# Patient Record
Sex: Female | Born: 1990 | Hispanic: No | Marital: Single | Smoking: Never smoker
Health system: Southern US, Community
[De-identification: ages and names within clinical notes are randomized; demographics above are authoritative.]

## PROBLEM LIST (undated history)

## (undated) ENCOUNTER — Inpatient Hospital Stay: Payer: Self-pay

---
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
## 2008-01-22 ENCOUNTER — Observation Stay: Payer: Self-pay | Admitting: Obstetrics and Gynecology

## 2008-03-10 ENCOUNTER — Observation Stay: Payer: Self-pay

## 2008-03-14 ENCOUNTER — Inpatient Hospital Stay: Payer: Self-pay | Admitting: Obstetrics and Gynecology

## 2009-05-12 IMAGING — US US OB US >=[ID] SNGL FETUS
1 series · 17 of 28 positions shown · non-contrast
Comparison: none

REASON FOR EXAM: 19 WEEKS PREGNANT, VAGINAL BLEEDING AND RIGHT PELVIC
PAIN STATUS POST LIFTING, ALSO EVALUATE APPENDIX
COMMENTS:

[Series 1: us ob us >=(id) sngl fetus · 17 of 72 slices shown]
[im 1/72]
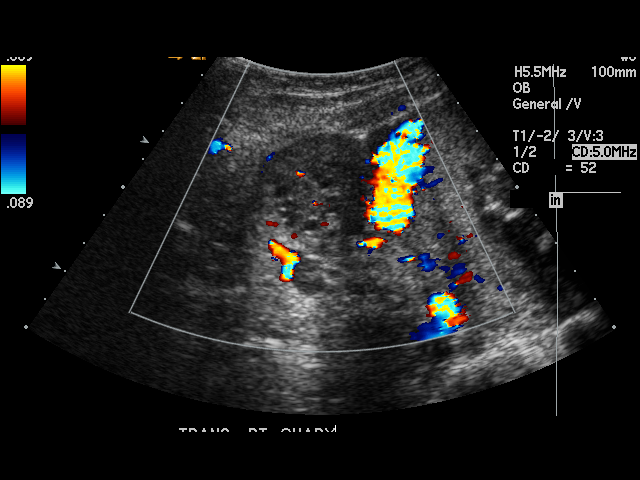
[im 6/72]
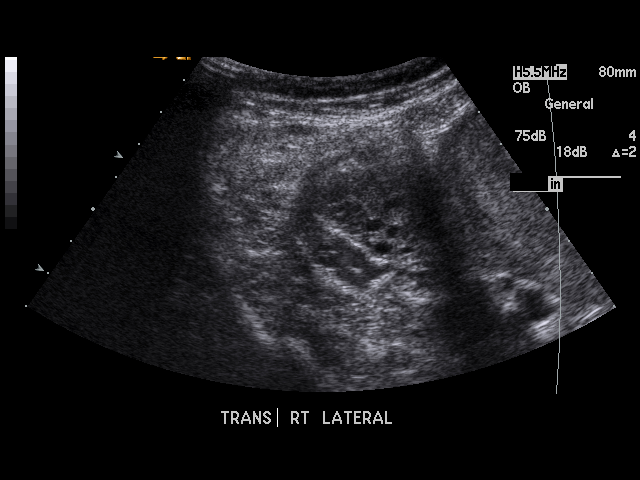
[im 11/72]
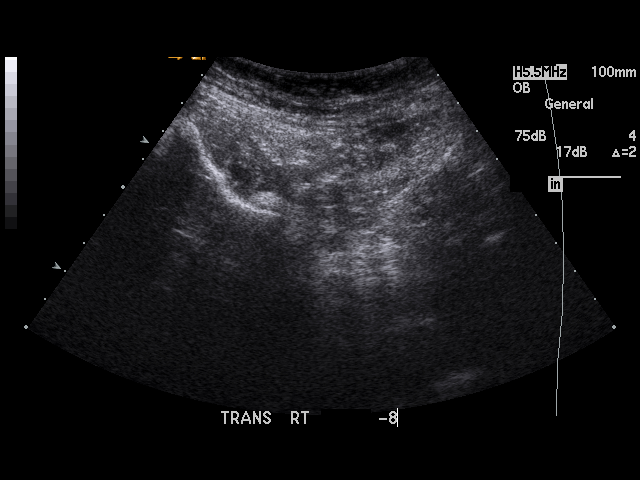
[im 14/72]
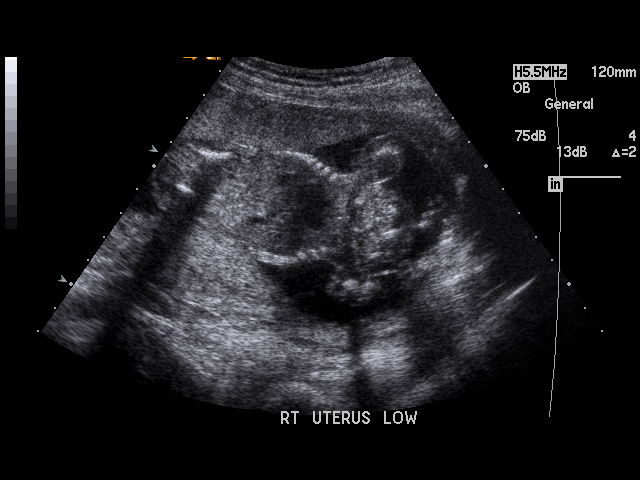
[im 19/72]
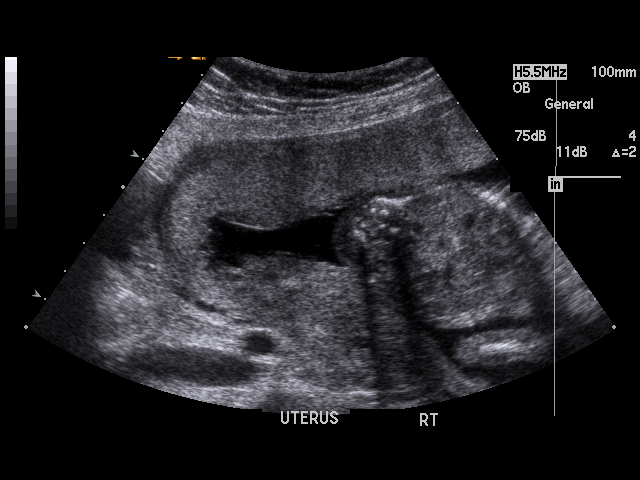
[im 24/72]
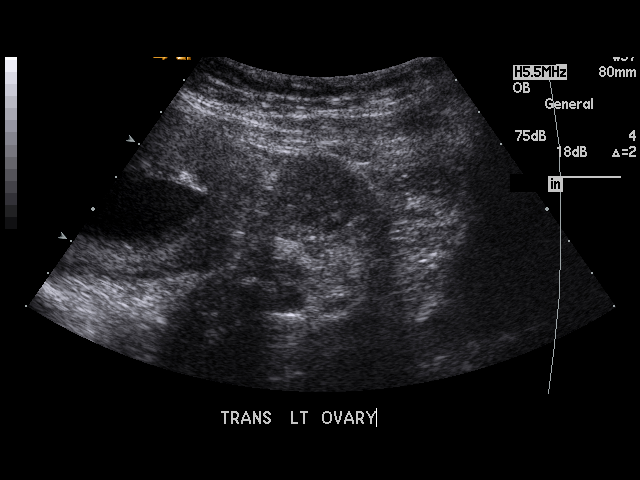
[im 27/72]
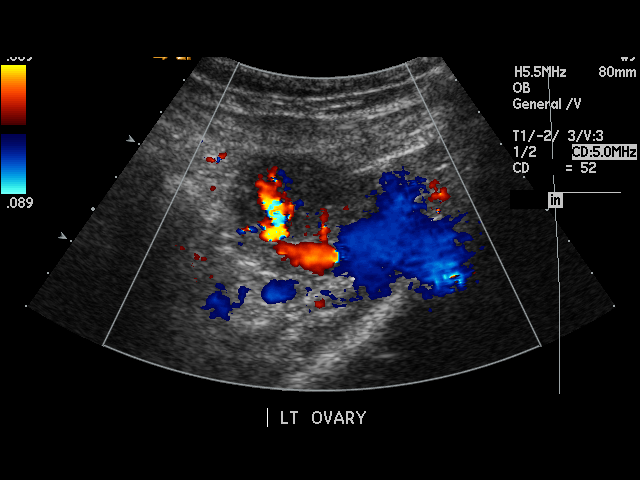
[im 32/72]
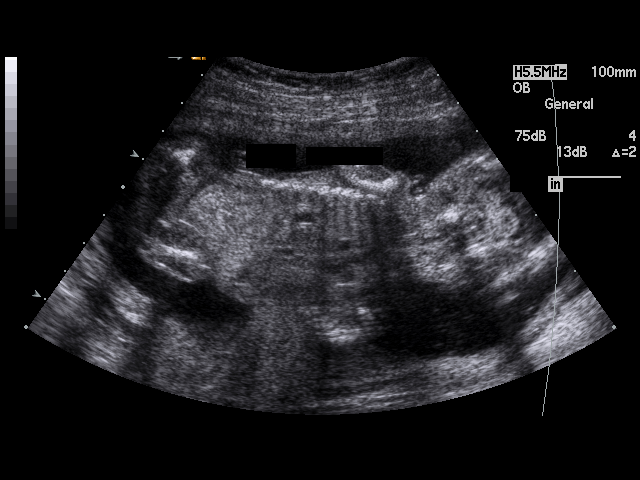
[im 37/72]
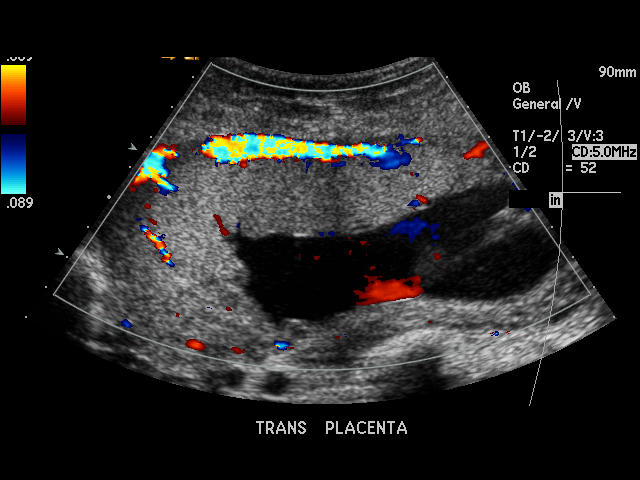
[im 40/72]
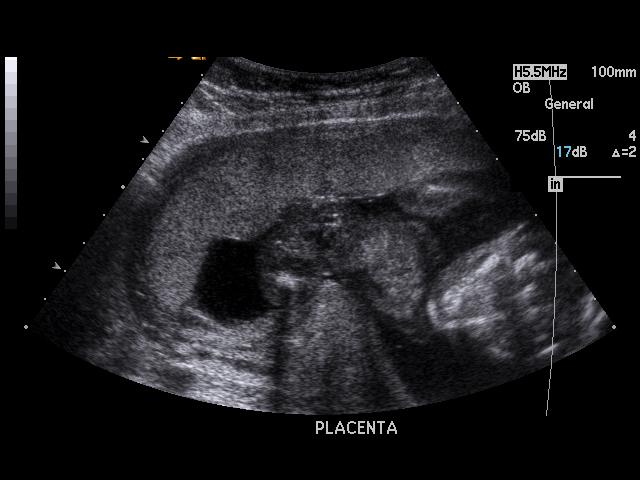
[im 45/72]
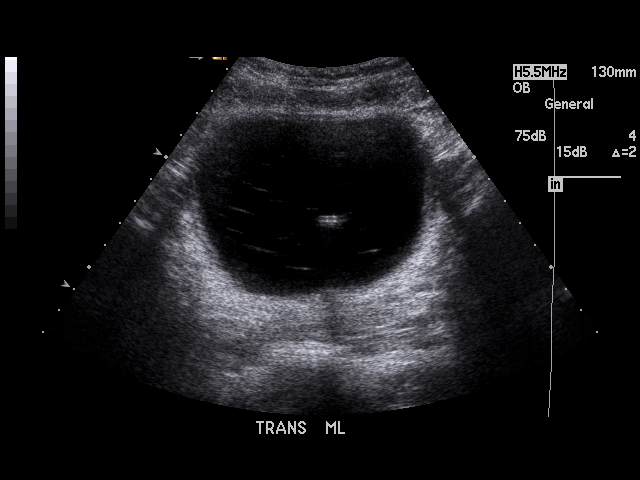
[im 48/72]
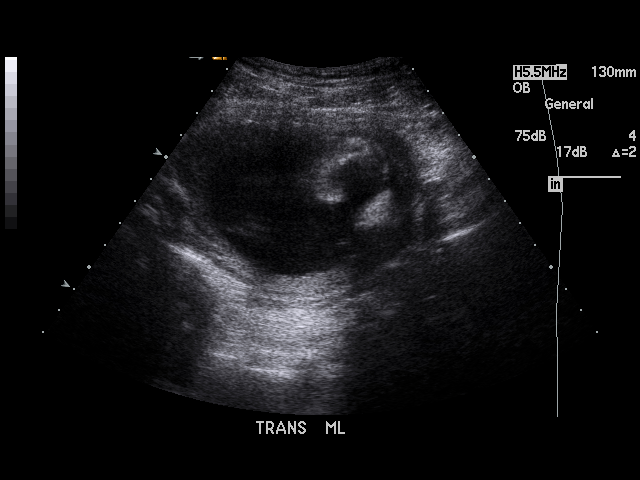
[im 53/72]
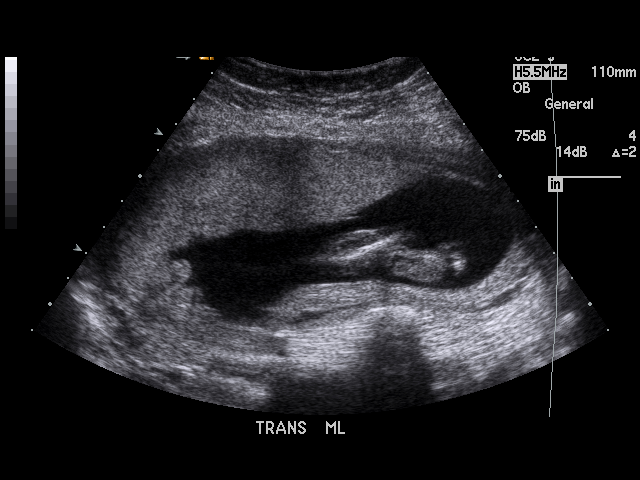
[im 58/72]
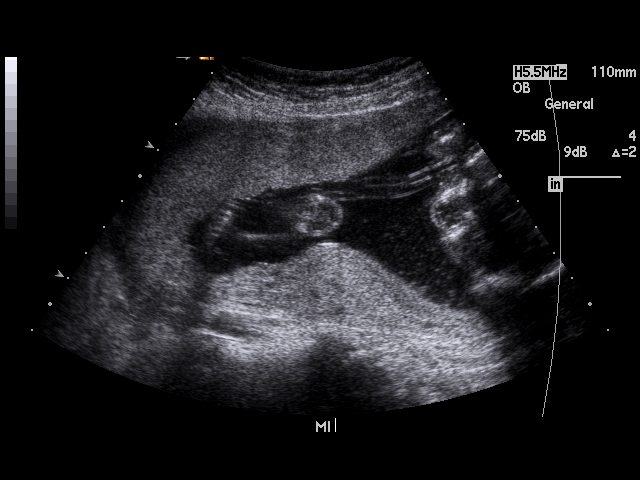
[im 61/72]
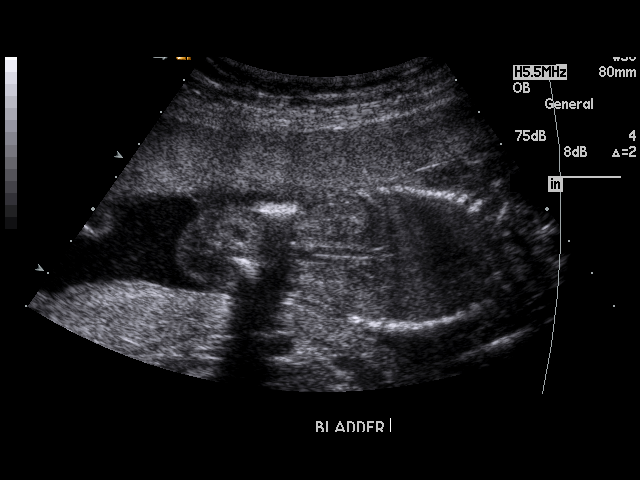
[im 66/72]
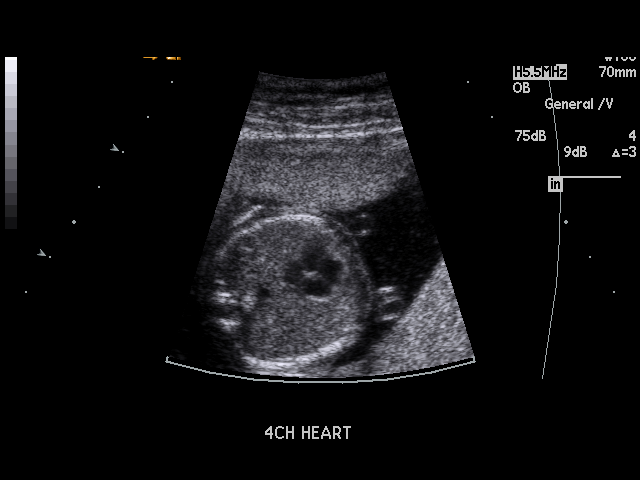
[im 72/72]
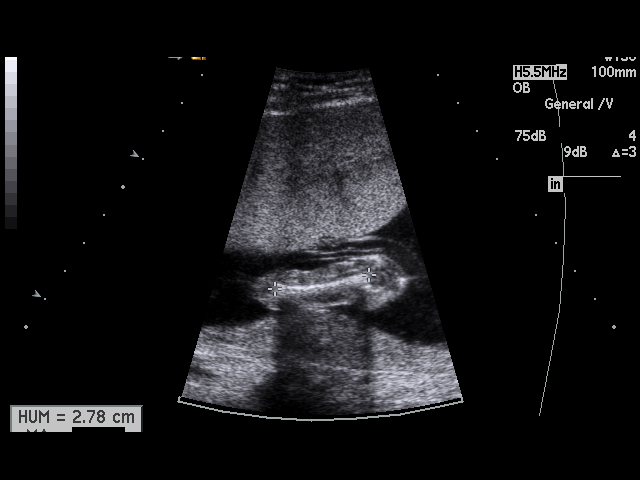

[17 of 28 positions shown; findings below may reference images not displayed]

PROCEDURE:     US  - US OB GREATER/OR EQUAL TO N1LLC  - October 19, 2007 [DATE]

RESULT:     Emergent sonographic evaluation of the pelvis demonstrates a
single intrauterine fetus with normal cardiac, trunk and extremity motion.
The fetal heart rate measures 147 beats per minute. The placenta is Grade 0
and anterior in position. There is no evidence of placenta previa or
marginal placenta. The amniotic fluid volume appears to be normal. The
gestational age is 18 weeks, 6 days sonographically with an estimated
delivery date of 03/15/2008. There is no gross fetal abnormality evident.
IMPRESSION: Intrauterine gestation of 18 weeks, 6 days sonographically
with an estimated delivery date of 03/15/2008 and a fetal heart rate of 147
beats per minute.

## 2009-07-19 IMAGING — US ULTRAOUND OB LIMITED - NRPT [facility]
1 series · 14 of 28 positions shown · non-contrast
Comparison: none

[Series 1: ultraound ob limited - nrpt [facility] · 0.27mm/px · 14 of 34 slices shown]
[im 2/34]
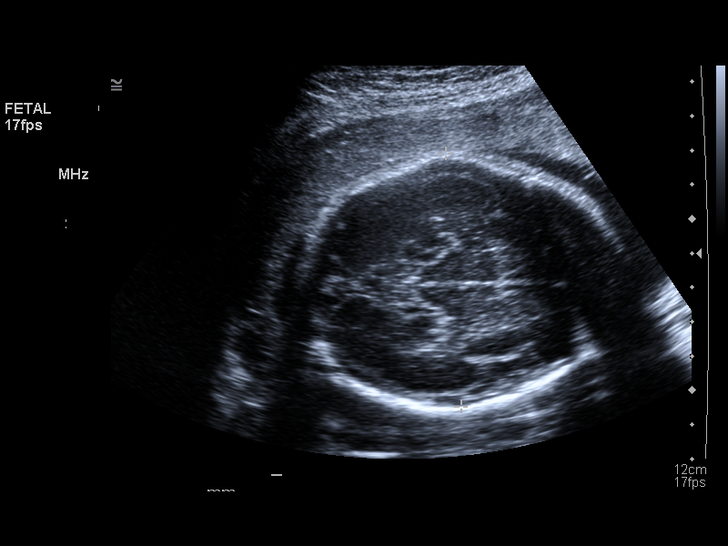
[im 4/34]
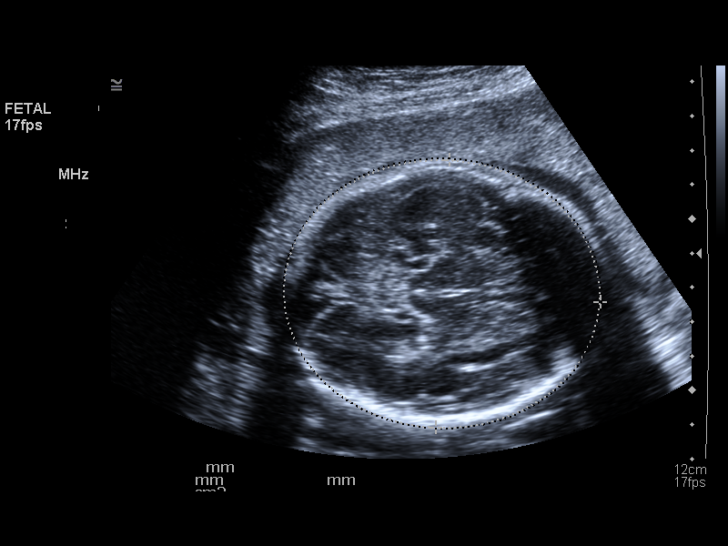
[im 7/34]
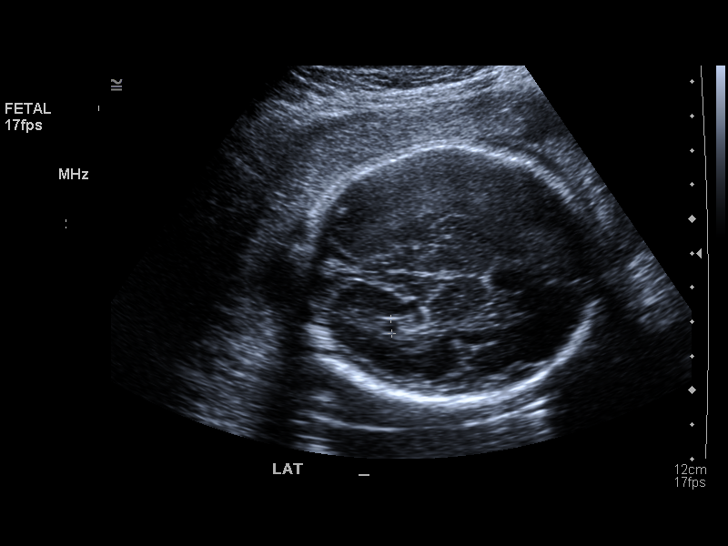
[im 9/34]
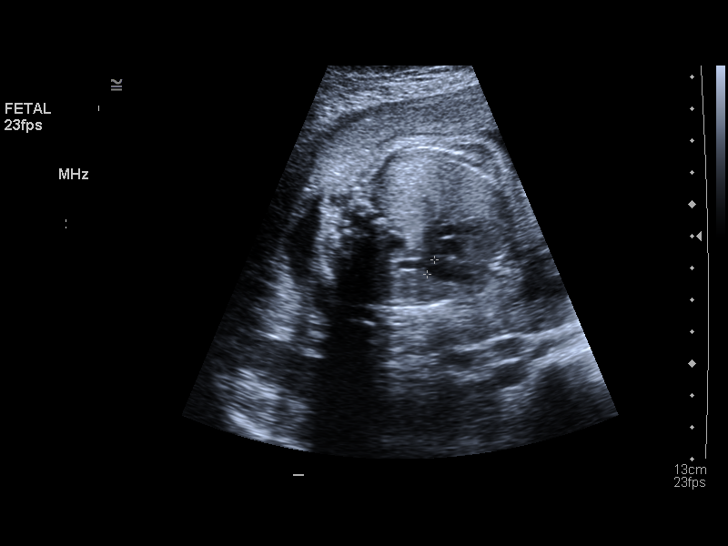
[im 12/34]
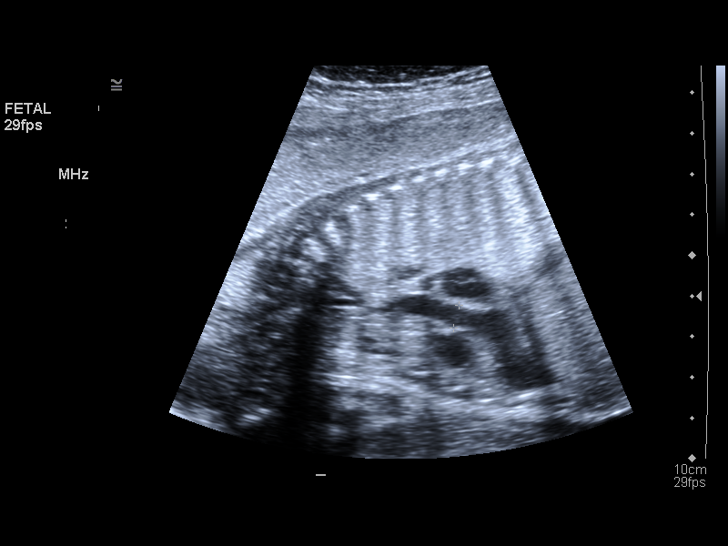
[im 14/34]
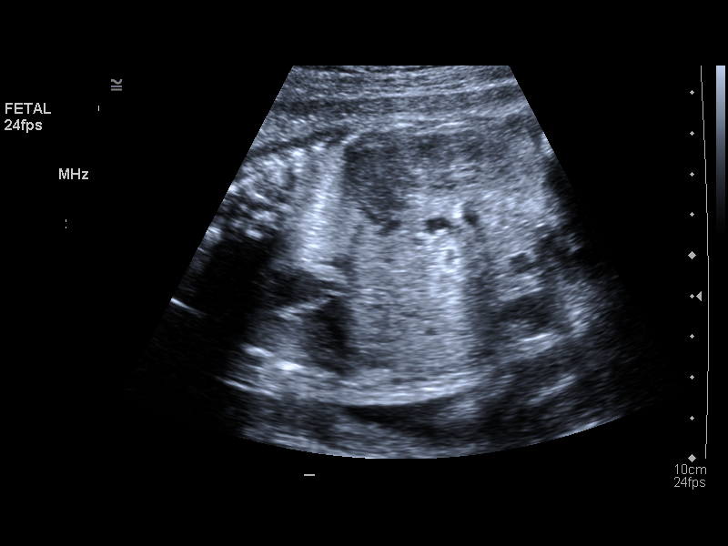
[im 16/34]
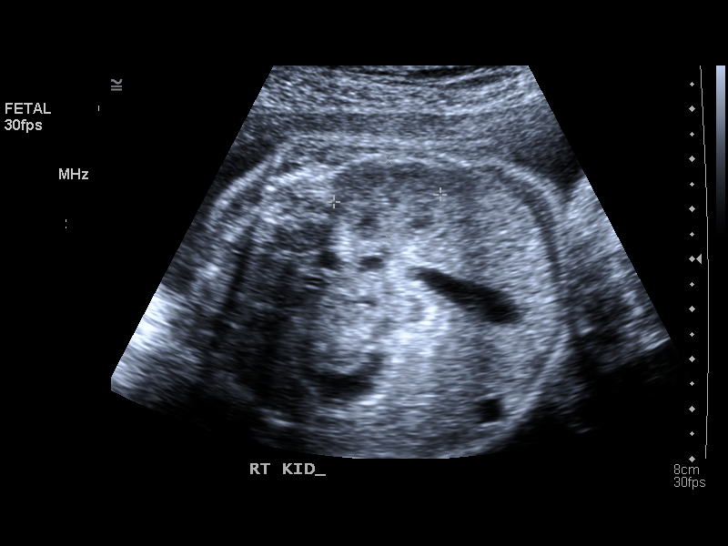
[im 19/34]
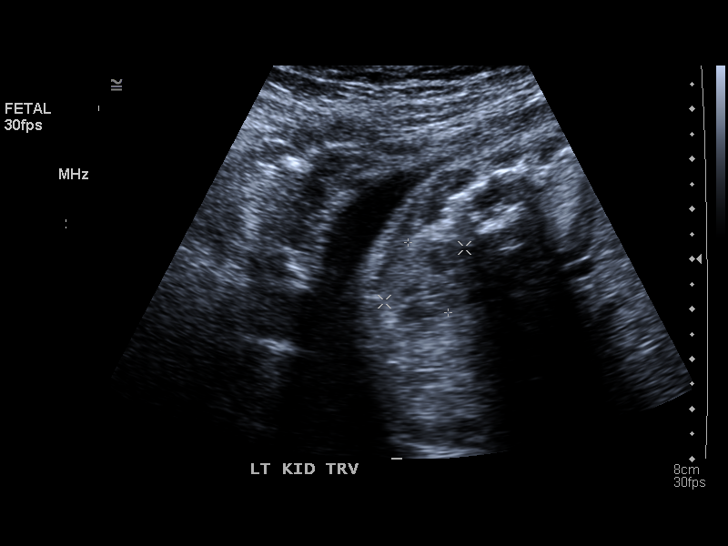
[im 21/34]
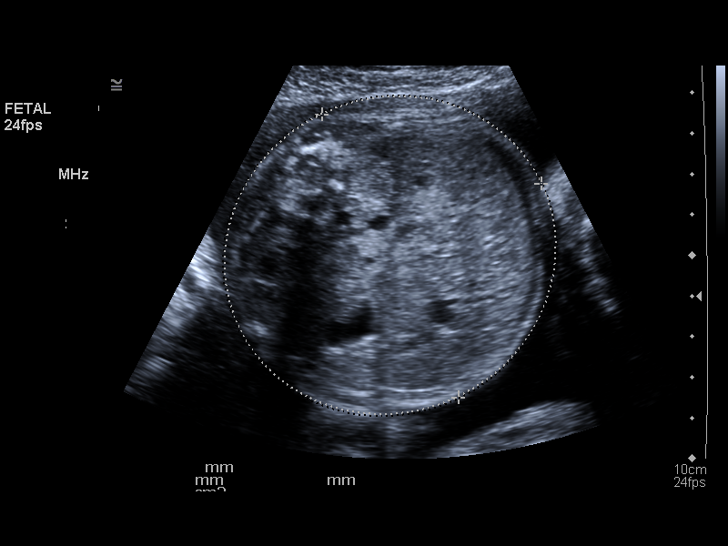
[im 24/34]
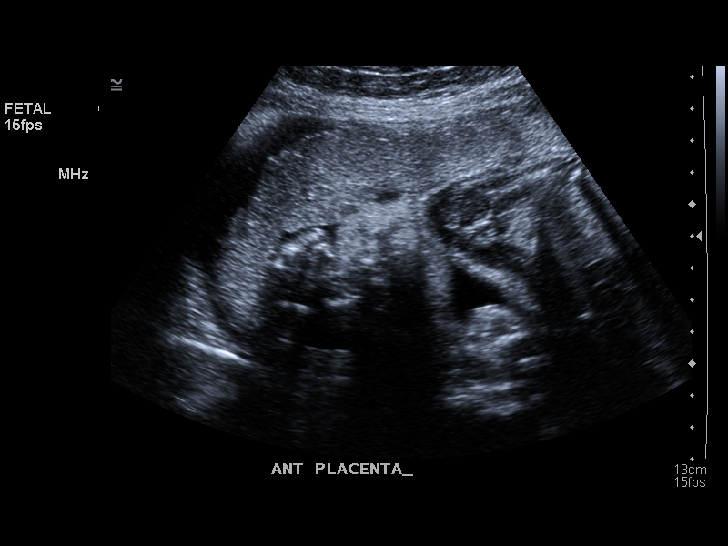
[im 26/34]
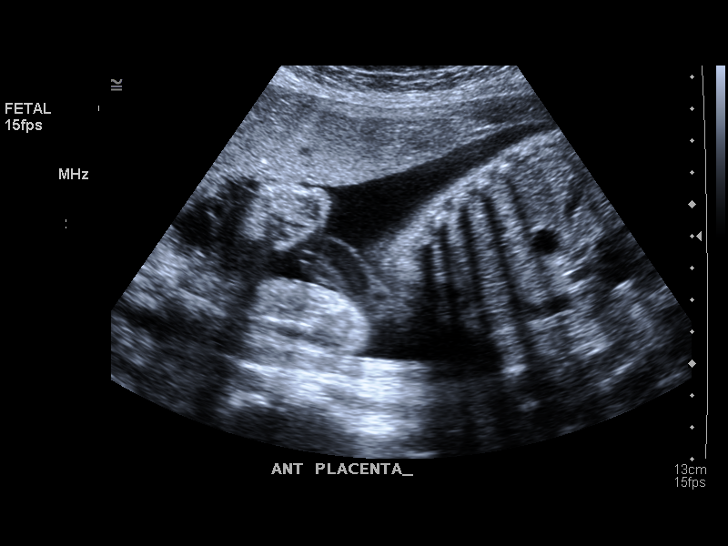
[im 29/34]
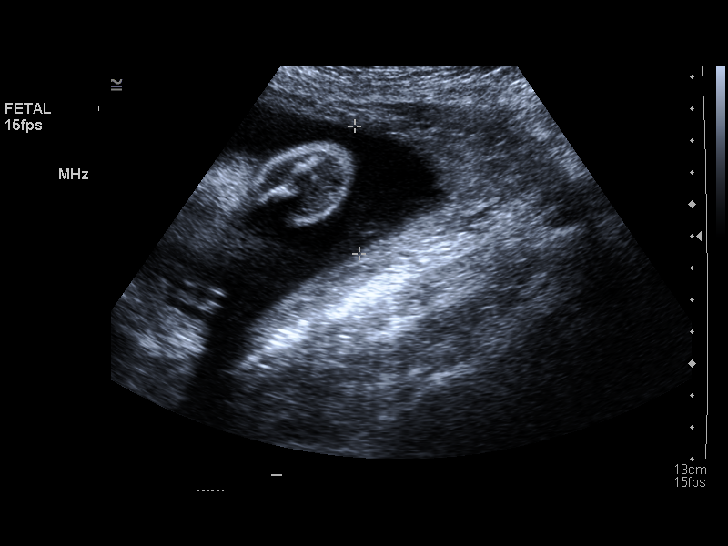
[im 31/34]
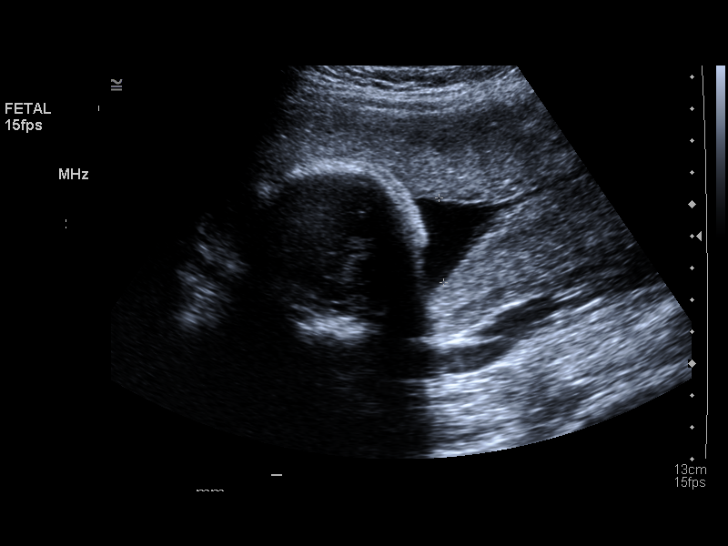
[im 34/34]
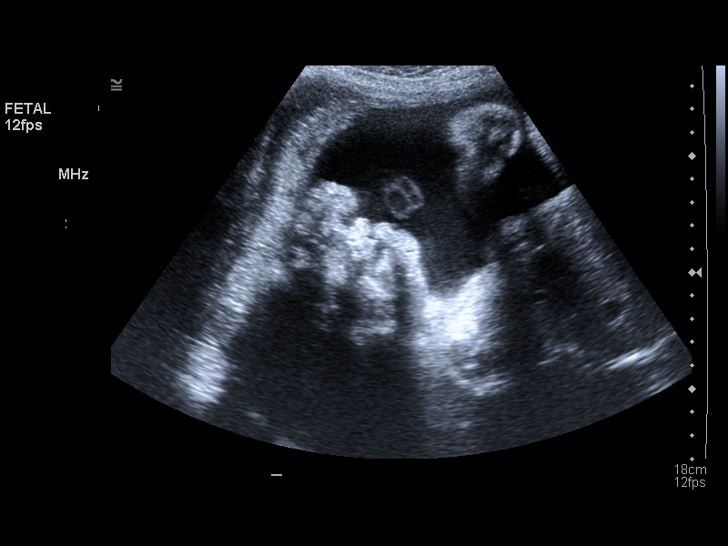

[14 of 28 positions shown; findings below may reference images not displayed]

IMAGES IMPORTED FROM THE [employer] WORKFLOW SYSTEM
NO DICTATION FOR STUDY

## 2009-10-19 ENCOUNTER — Observation Stay: Payer: Self-pay | Admitting: Obstetrics and Gynecology

## 2009-10-20 ENCOUNTER — Ambulatory Visit: Payer: Self-pay | Admitting: Obstetrics and Gynecology

## 2009-10-24 ENCOUNTER — Observation Stay: Payer: Self-pay | Admitting: Obstetrics and Gynecology

## 2009-11-02 ENCOUNTER — Inpatient Hospital Stay: Payer: Self-pay | Admitting: Obstetrics and Gynecology

## 2011-05-19 IMAGING — US ABDOMEN ULTRASOUND
1 series · 17 of 25 positions shown · non-contrast
Comparison: none

REASON FOR EXAM: epigastric pain - gall bladder
COMMENTS:

[Series 1: abdomen ultrasound · 17 of 66 slices shown]
[im 1/66]
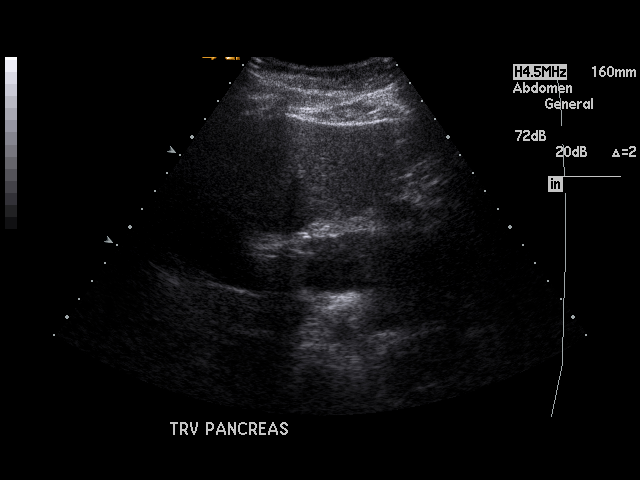
[im 6/66]
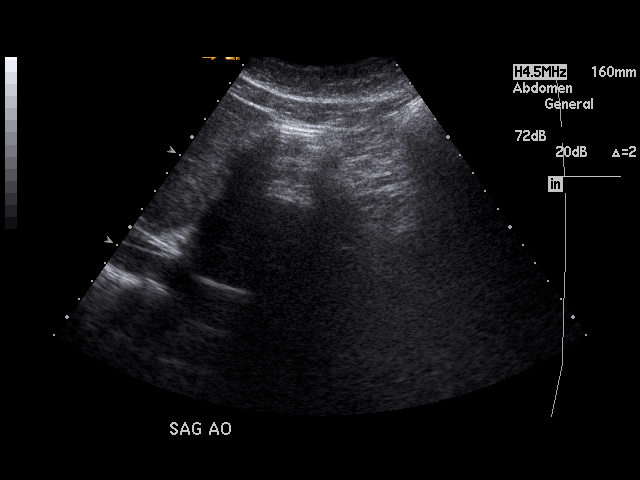
[im 9/66]
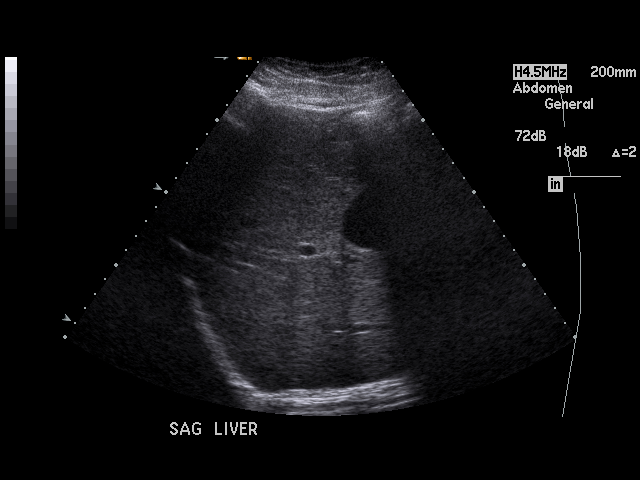
[im 14/66]
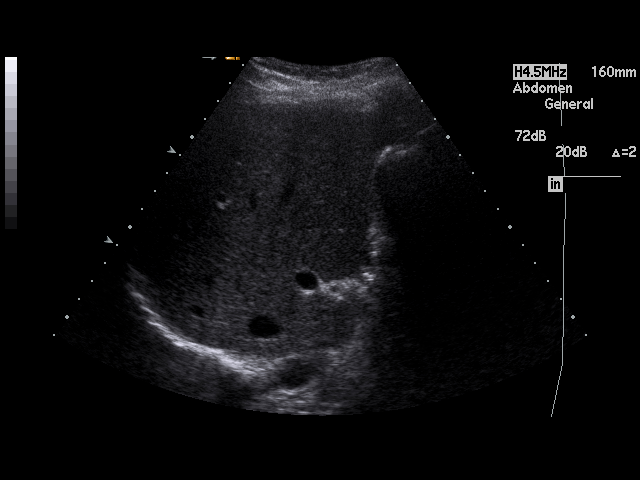
[im 17/66]
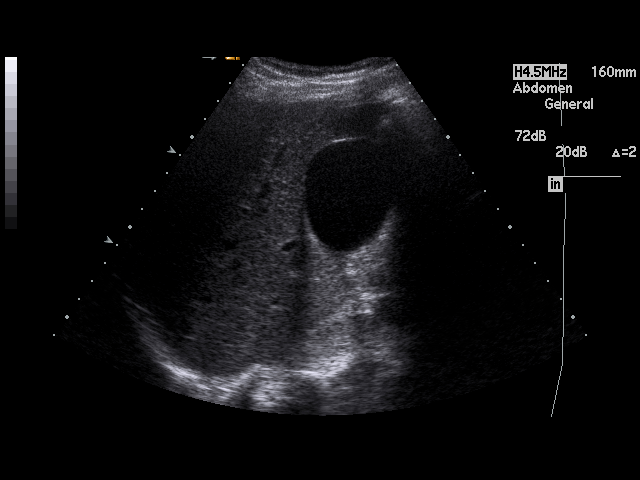
[im 22/66]
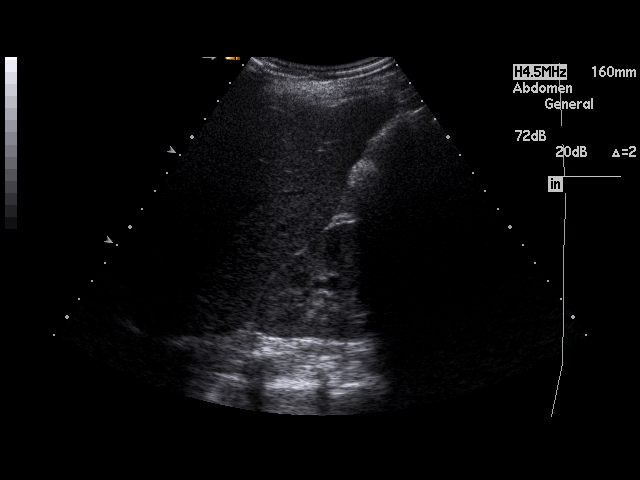
[im 25/66]
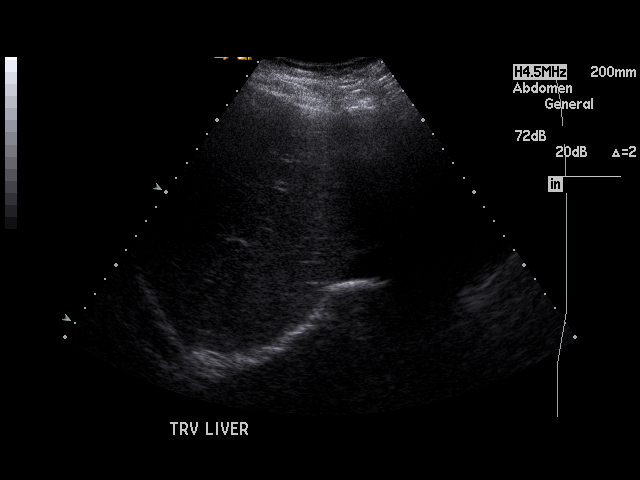
[im 30/66]
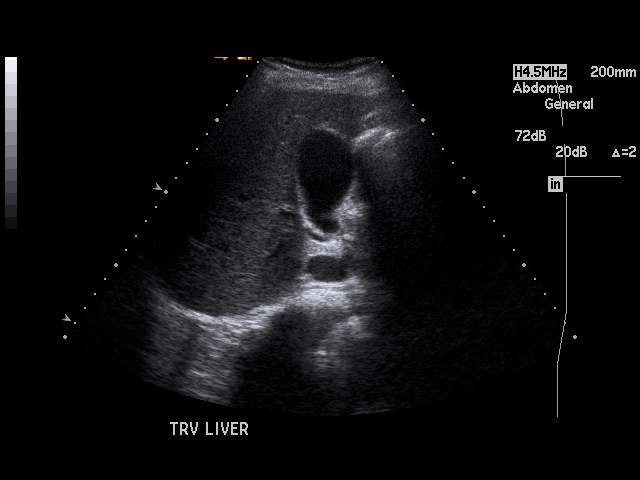
[im 33/66]
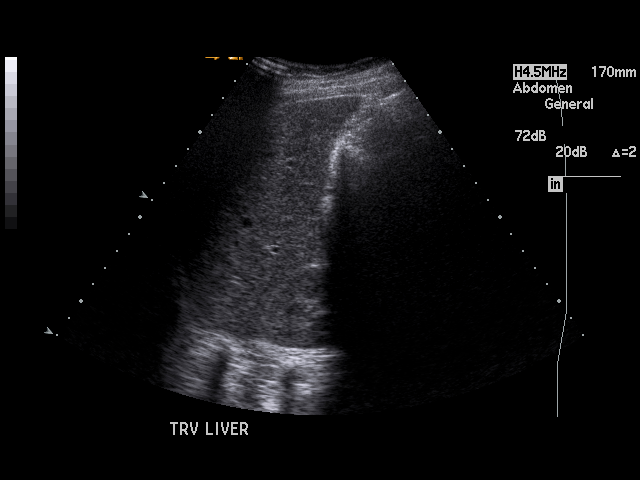
[im 36/66]
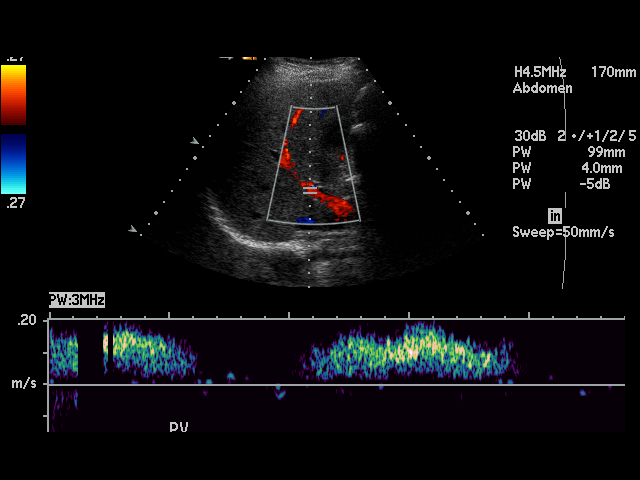
[im 41/66]
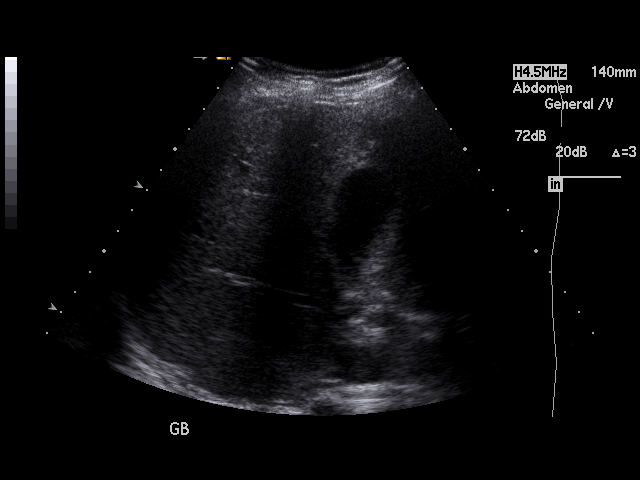
[im 44/66]
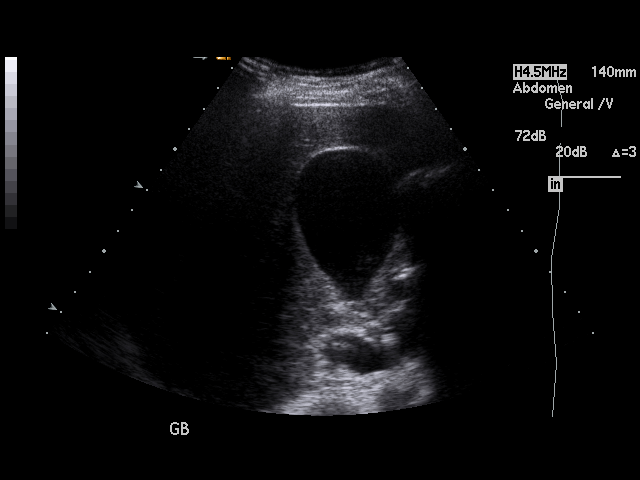
[im 49/66]
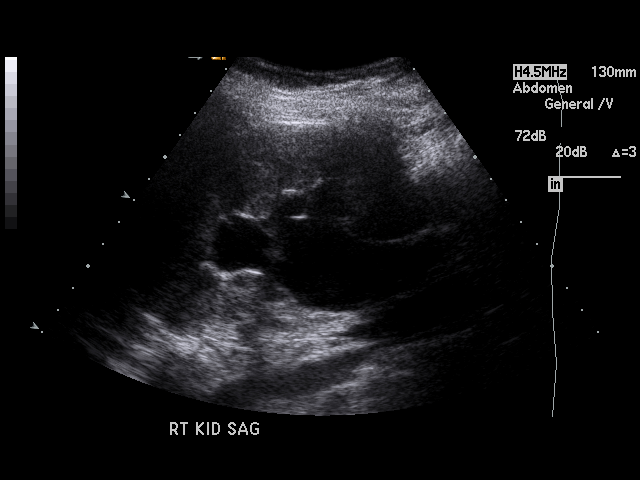
[im 52/66]
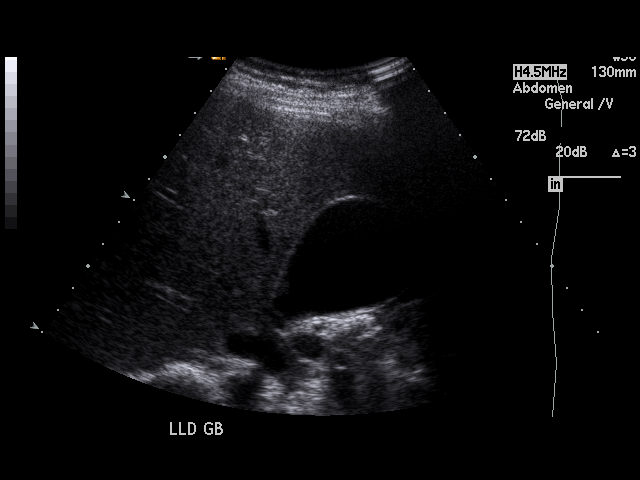
[im 57/66]
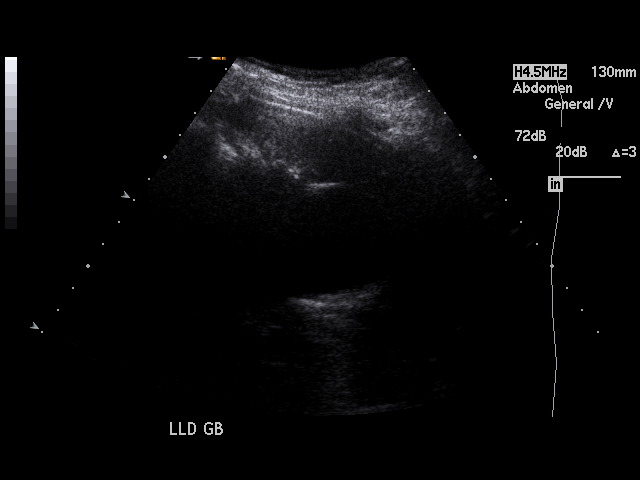
[im 60/66]
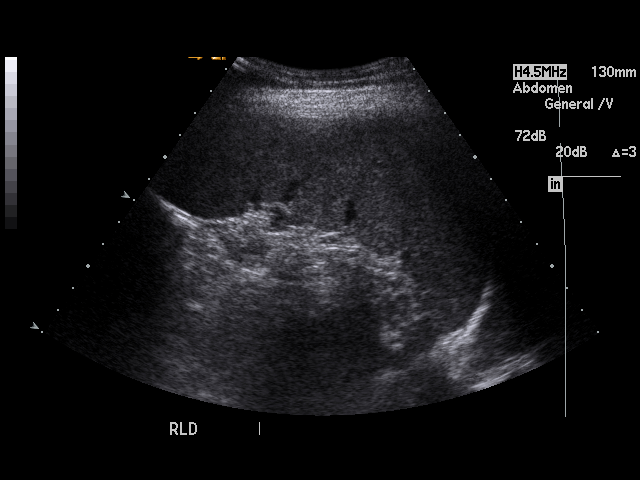
[im 66/66]
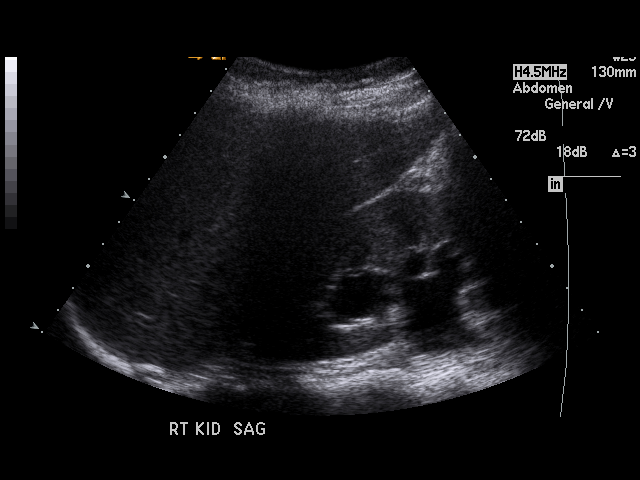

[17 of 25 positions shown; findings below may reference images not displayed]

PROCEDURE:     US  - US ABDOMEN GENERAL SURVEY  - October 25, 2009  [DATE]

RESULT:     The liver exhibits normal echotexture with no focal mass nor
ductal dilation. Portal venous flow is normal in direction toward the liver.
The pancreas, spleen, abdominal aorta, and gallbladder are normal in
appearance. There is no positive sonographic Murphy's sign. The common bile
duct is normal at 3.4 mm in diameter.

The right kidney measures 11.7 x 5.8 x 5.5 cm and exhibits moderate
hydronephrosis and hydroureter. The left kidney measures 15.2 x 6.1 x 5.1 cm
and exhibits no hydronephrosis.
IMPRESSION: 1. There is moderate hydronephrosis and hydroureter on the right presumably
related to the gravid uterus.
2. The left kidney and ureter exhibit no acute abnormality.
3. I see no abnormality elsewhere in the visualized portions of the abdomen.

## 2015-07-04 ENCOUNTER — Observation Stay
Admission: EM | Admit: 2015-07-04 | Discharge: 2015-07-04 | Disposition: A | Discharge status: Home / Self Care | Payer: Self-pay | Attending: Certified Nurse Midwife | Admitting: Certified Nurse Midwife

## 2015-07-04 DIAGNOSIS — M549 Dorsalgia, unspecified: Secondary | ICD-10-CM | POA: Diagnosis present

## 2015-07-04 DIAGNOSIS — O99891 Other specified diseases and conditions complicating pregnancy: Secondary | ICD-10-CM | POA: Diagnosis present

## 2015-07-04 DIAGNOSIS — O2342 Unspecified infection of urinary tract in pregnancy, second trimester: Principal | ICD-10-CM | POA: Insufficient documentation

## 2015-07-04 DIAGNOSIS — O9989 Other specified diseases and conditions complicating pregnancy, childbirth and the puerperium: Secondary | ICD-10-CM

## 2015-07-04 DIAGNOSIS — Z3A25 25 weeks gestation of pregnancy: Secondary | ICD-10-CM | POA: Insufficient documentation

## 2015-07-04 LAB — URINALYSIS COMPLETE WITH MICROSCOPIC (ARMC ONLY)
Bilirubin Urine: NEGATIVE
Glucose, UA: NEGATIVE mg/dL
NITRITE: NEGATIVE
PROTEIN: 100 mg/dL — AB
SPECIFIC GRAVITY, URINE: 1.018 (ref 1.005–1.030)
pH: 6 (ref 5.0–8.0)

## 2015-07-04 MED ORDER — PHENAZOPYRIDINE HCL 100 MG PO TABS
200.0000 mg | ORAL_TABLET | Freq: Three times a day (TID) | ORAL | Status: DC
Start: 2015-07-04 — End: 2015-07-04
  Administered 2015-07-04: 200 mg via ORAL
  Filled 2015-07-04: qty 2

## 2015-07-04 MED ORDER — NITROFURANTOIN MONOHYD MACRO 100 MG PO CAPS
100.0000 mg | ORAL_CAPSULE | Freq: Two times a day (BID) | ORAL | Status: DC
  Administered 2015-07-04: 100 mg via ORAL
  Filled 2015-07-04: qty 1

## 2015-07-04 NOTE — Discharge Instructions (Signed)
Call provider or return to [facility] with: ? ?1. Regular contractions ?2. Leaking of fluid from your vagina ?3. Vaginal bleeding: Bright red or heavy like a period ?4. Decreased Fetal movement  ?

## 2015-07-04 NOTE — OB Triage Note (Signed)
Pt presents to L&D with c/o urinary symptoms of dysuria and oinkish discharge/bleeding, and back pain. EFM and toco applied and explianed. Plan to monitor fetal and maternal well being and assess pt complaint.CC urine obtained

## 2015-07-04 NOTE — Final Progress Note (Signed)
Physician Final Progress Note  Patient ID: Lauren BrodLindsay R Caetano MRN: [id number] DOB/AGE: 1991-02-13 24 y.o.  Admit date: 07/04/2015 Admitting provider: Nadara Mustardobert P Harris, MD Discharge date: 07/04/2015   Admission Diagnoses:Dysuria, hematuria and urinary frequency; lower back pain Pregnancy at 25.5 weeks  Discharge Diagnoses:  Urinary tract infection  Consults: None  Significant Findings/ Diagnostic Studies: 25 year old G4 P2103 with EDC=10/12/2015 by LMP=01/05/2015 presents with c/o lower back pain x 3 days and dysuria, hematuria and urinary frequency x 1 day. Denies contractions or abdominal pain or fever. No CVAT on exam FHR baseline is 140s with accelerations to 150s, with moderate variability Patient has not started prenatal care. Has had no PNC with other children and has unattended vaginal deliveries at home. BP 122/68 mmHg  Pulse 117  Temp(Src) 98 F (36.7 C) (Oral)  Ht 5\' 4"  (1.626 m)  Wt 72.576 kg (160 lb)  BMI 27.45 kg/m2  LMP 01/05/2015  Results for orders placed or performed during the hospital encounter of 07/04/15 (from the past 24 hour(s))  Urinalysis complete, with microscopic ([facility] only)     Status: Abnormal   Collection Time: 07/04/15  2:18 AM  Result Value Ref Range   Color, Urine YELLOW (A) YELLOW   APPearance CLOUDY (A) CLEAR   Glucose, UA NEGATIVE NEGATIVE mg/dL   Bilirubin Urine NEGATIVE NEGATIVE   Ketones, ur TRACE (A) NEGATIVE mg/dL   Specific Gravity, Urine 1.018 1.005 - 1.030   Hgb urine dipstick 2+ (A) NEGATIVE   pH 6.0 5.0 - 8.0   Protein, ur 100 (A) NEGATIVE mg/dL   Nitrite NEGATIVE NEGATIVE   Leukocytes, UA 3+ (A) NEGATIVE   RBC / HPF TOO NUMEROUS TO COUNT 0 - 5 RBC/hpf   WBC, UA TOO NUMEROUS TO COUNT 0 - 5 WBC/hpf   Bacteria, UA FEW (A) NONE SEEN   Squamous Epithelial / LPF 6-30 (A) NONE SEEN   Mucous PRESENT    Urine culture pending-will call [phone] with results if bacteria not sensitive to macrobid Procedures: *none  Discharge  Condition: stable  Disposition: home  Diet: Regular diet  Discharge Activity: Activity as tolerated  Discharge medications: Macrobid 100 mgm BID x 1 week (given first dose) Pyridium 200 mgm x1` dose before discharge     Medication List    ASK your doctor about these medications        multivitamin-prenatal 27-0.8 MG Tabs tablet  Take 1 tablet by mouth daily at 12 noon.           SignedFarrel Conners: Cuyler Vandyken 07/04/2015, 3:43 AM

## 2015-07-06 LAB — URINE CULTURE

## 2016-05-08 ENCOUNTER — Encounter (HOSPITAL_COMMUNITY): Payer: Self-pay

## 2017-08-08 ENCOUNTER — Other Ambulatory Visit: Payer: Self-pay

## 2017-08-08 ENCOUNTER — Emergency Department: Payer: Self-pay

## 2017-08-08 ENCOUNTER — Emergency Department
Admission: EM | Admit: 2017-08-08 | Discharge: 2017-08-08 | Disposition: A | Discharge status: Home / Self Care | Payer: Self-pay | Attending: Emergency Medicine | Admitting: Emergency Medicine

## 2017-08-08 ENCOUNTER — Encounter: Payer: Self-pay | Admitting: Emergency Medicine

## 2017-08-08 DIAGNOSIS — R55 Syncope and collapse: Secondary | ICD-10-CM | POA: Insufficient documentation

## 2017-08-08 DIAGNOSIS — O418X11 Other specified disorders of amniotic fluid and membranes, first trimester, fetus 1: Secondary | ICD-10-CM | POA: Insufficient documentation

## 2017-08-08 DIAGNOSIS — O9989 Other specified diseases and conditions complicating pregnancy, childbirth and the puerperium: Secondary | ICD-10-CM | POA: Insufficient documentation

## 2017-08-08 DIAGNOSIS — R109 Unspecified abdominal pain: Secondary | ICD-10-CM | POA: Insufficient documentation

## 2017-08-08 DIAGNOSIS — O418X10 Other specified disorders of amniotic fluid and membranes, first trimester, not applicable or unspecified: Secondary | ICD-10-CM

## 2017-08-08 DIAGNOSIS — O26811 Pregnancy related exhaustion and fatigue, first trimester: Secondary | ICD-10-CM | POA: Insufficient documentation

## 2017-08-08 DIAGNOSIS — Z3A10 10 weeks gestation of pregnancy: Secondary | ICD-10-CM | POA: Insufficient documentation

## 2017-08-08 DIAGNOSIS — O210 Mild hyperemesis gravidarum: Secondary | ICD-10-CM | POA: Insufficient documentation

## 2017-08-08 DIAGNOSIS — O468X1 Other antepartum hemorrhage, first trimester: Secondary | ICD-10-CM | POA: Insufficient documentation

## 2017-08-08 DIAGNOSIS — Z79899 Other long term (current) drug therapy: Secondary | ICD-10-CM | POA: Insufficient documentation

## 2017-08-08 LAB — URINALYSIS, COMPLETE (UACMP) WITH MICROSCOPIC
Bilirubin Urine: NEGATIVE
Glucose, UA: NEGATIVE mg/dL
HGB URINE DIPSTICK: NEGATIVE
Ketones, ur: 20 mg/dL — AB
Leukocytes, UA: NEGATIVE
NITRITE: NEGATIVE
Protein, ur: NEGATIVE mg/dL
Specific Gravity, Urine: 1.024 (ref 1.005–1.030)
pH: 5 (ref 5.0–8.0)

## 2017-08-08 LAB — CBC
HEMATOCRIT: 43.3 % (ref 35.0–47.0)
Hemoglobin: 14.9 g/dL (ref 12.0–16.0)
MCH: 28.5 pg (ref 26.0–34.0)
MCHC: 34.5 g/dL (ref 32.0–36.0)
MCV: 82.6 fL (ref 80.0–100.0)
Platelets: 349 10*3/uL (ref 150–440)
RBC: 5.24 MIL/uL — ABNORMAL HIGH (ref 3.80–5.20)
RDW: 13 % (ref 11.5–14.5)
WBC: 11.4 10*3/uL — ABNORMAL HIGH (ref 3.6–11.0)

## 2017-08-08 LAB — BASIC METABOLIC PANEL
Anion gap: 11 (ref 5–15)
BUN: 8 mg/dL (ref 6–20)
CALCIUM: 9.6 mg/dL (ref 8.9–10.3)
CO2: 22 mmol/L (ref 22–32)
Chloride: 102 mmol/L (ref 101–111)
Creatinine, Ser: 0.48 mg/dL (ref 0.44–1.00)
GFR calc Af Amer: >60 mL/min (ref >60)
GLUCOSE: 101 mg/dL — AB (ref 65–99)
Potassium: 3.6 mmol/L (ref 3.5–5.1)
Sodium: 135 mmol/L (ref 135–145)

## 2017-08-08 LAB — HCG, QUANTITATIVE, PREGNANCY: HCG, BETA CHAIN, QUANT, S: 190756 m[IU]/mL — AB (ref <5)

## 2017-08-08 LAB — TROPONIN I: Troponin I: <0.03 ng/mL (ref <0.03)

## 2017-08-08 MED ORDER — ONDANSETRON HCL 4 MG/2ML IJ SOLN
INTRAMUSCULAR | Status: AC
  Administered 2017-08-08: 4 mg via INTRAVENOUS
  Filled 2017-08-08: qty 2

## 2017-08-08 MED ORDER — SODIUM CHLORIDE 0.9 % IV SOLN
Freq: Once | INTRAVENOUS | Status: AC
  Administered 2017-08-08: 1000 mL via INTRAVENOUS

## 2017-08-08 MED ORDER — ONDANSETRON 4 MG PO TBDP: 4.0000 mg | ORAL_TABLET | Freq: Three times a day (TID) | ORAL | 0 refills | Status: AC | PRN

## 2017-08-08 MED ORDER — ONDANSETRON HCL 4 MG/2ML IJ SOLN
4.0000 mg | Freq: Once | INTRAMUSCULAR | Status: AC
  Administered 2017-08-08: 4 mg via INTRAVENOUS

## 2017-08-08 NOTE — ED Notes (Signed)
Report given to Kelley, RN.

## 2017-08-08 NOTE — ED Notes (Signed)
Patient states she is feeling much better after fluids and zofran.

## 2017-08-08 NOTE — ED Triage Notes (Signed)
Pt presents to ED after she had a syncopal episode approx 1 hour ago while walking in her home. Pt states she fell on her right side and denies hitting her head. Currently [redacted] weeks pregnant and has been experiencing  frequent vomiting during this pregnancy with dizziness. Hx of the same during her first pregnancy. Pt states she has a throbbing headache behind her eyes and at her temples that started after this event.

## 2017-08-08 NOTE — ED Notes (Signed)
Pt ambulatory to POV without difficulty. VSS. NAD. Discharge instructions, RX and follow up reviewed. All questions answered.  

## 2017-08-08 NOTE — ED Provider Notes (Signed)
[facility] Emergency Department Provider Note ____   First MD Initiated Contact with Patient 08/08/17 [phone]     (approximate)  I have reviewed the triage vital signs and the nursing notes.   HISTORY  Chief Complaint Loss of Consciousness    HPI Lauren Santiago is a 27 y.o. female G5 P4 approximately [redacted] weeks pregnant presents to the emergency department with multiple episodes of vomiting yesterday accompanied with dizziness.  Patient states dizziness is provoked with positional change.  Patient states that approximately 1 hour before arriving to the emergency department she had a syncopal episode.  Patient states she was walking into another room when she passed out.  Patient states that she believes she fell onto her right side as she has some right flank discomfort.   Past medical history For successful pregnancies Hyperemesis gravidarum in first pregnancy  Patient Active Problem List   Diagnosis Date Noted  . Back pain affecting pregnancy in second trimester 07/04/2015    History reviewed. No pertinent surgical history.  Prior to Admission medications   Medication Sig Start Date End Date Taking? Authorizing Provider  Prenatal Vit-Fe Fumarate-FA (MULTIVITAMIN-PRENATAL) 27-0.8 MG TABS tablet Take 1 tablet by mouth daily at 12 noon.    [provider]    Allergies Penicillins  No family history on file.  Social History Social History   Tobacco Use  . Smoking status: Never Smoker  . Smokeless tobacco: Never Used  Substance Use Topics  . Alcohol use: No    Frequency: Never  . Drug use: No    Review of Systems Constitutional: No fever/chills Eyes: No visual changes. ENT: No sore throat. Cardiovascular: Denies chest pain. Respiratory: Denies shortness of breath. Gastrointestinal: Positive for right flank pain no nausea, no vomiting.  No diarrhea.  No constipation. Genitourinary: Negative for dysuria. Musculoskeletal: Negative  for neck pain.  Negative for back pain. Integumentary: Negative for rash. Neurological: Negative for headaches, focal weakness or numbness.   ____________________________________________   PHYSICAL EXAM:  VITAL SIGNS: ED Triage Vitals  Enc Vitals Group     BP 08/08/17 0206 121/81     Pulse Rate 08/08/17 0206 (!) 118     Resp 08/08/17 0206 18     Temp 08/08/17 0206 (!) 97.5 F (36.4 C)     Temp Source 08/08/17 0206 Oral     SpO2 08/08/17 0206 100 %     Weight 08/08/17 0207 77.1 kg (170 lb)     Height 08/08/17 0207 1.626 m (5\' 4" )     Head Circumference --      Peak Flow --      Pain Score 08/08/17 0207 6     Pain Loc --      Pain Edu? --      Excl. in GC? --     Constitutional: Alert and oriented. Well appearing and in no acute distress. Eyes: Conjunctivae are normal. PERRL. EOMI. Head: Atraumatic. Mouth/Throat: Mucous membranes are dry. Oropharynx non-erythematous. Neck: No stridor.   Cardiovascular: Normal rate, regular rhythm. Good peripheral circulation. Grossly normal heart sounds. Respiratory: Normal respiratory effort.  No retractions. Lungs CTAB. Gastrointestinal: Soft and nontender. No distention. :  Musculoskeletal: No lower extremity tenderness nor edema. No gross deformities of extremities. Neurologic:  Normal speech and language. No gross focal neurologic deficits are appreciated.  Skin:  Skin is warm, dry and intact. No rash noted. Psychiatric: Mood and affect are normal. Speech and behavior are normal.  ____________________________________________   LABS (  all labs ordered are listed, but only abnormal results are displayed)  Labs Reviewed  BASIC METABOLIC PANEL - Abnormal; Notable for the following components:      Result Value   Glucose, Bld 101 (*)    All other components within normal limits  CBC - Abnormal; Notable for the following components:   WBC 11.4 (*)    RBC 5.24 (*)    All other components within normal limits  URINALYSIS, COMPLETE  (UACMP) WITH MICROSCOPIC - Abnormal; Notable for the following components:   Color, Urine AMBER (*)    APPearance HAZY (*)    Ketones, ur 20 (*)    Bacteria, UA RARE (*)    Squamous Epithelial / LPF 6-30 (*)    All other components within normal limits  HCG, QUANTITATIVE, PREGNANCY - Abnormal; Notable for the following components:   hCG, Beta Chain, Quant, S 190,756 (*)    All other components within normal limits  TROPONIN I  CBG MONITORING, ED   ____________________________________________  EKG  ED ECG REPORT I, Fernando Salinas N Cathi Hazan, the attending physician, personally viewed and interpreted this ECG.   Date: 08/08/2017  EKG Time: 2:08 AM  Rate: 107  Rhythm: Sinus tachycardia  Axis: Normal  Intervals: Normal  ST&T Change: None  ____________________________________________  RADIOLOGY I,  N Gustabo Gordillo, personally viewed and evaluated these images (plain radiographs) as part of my medical decision making, as well as reviewing the written report by the radiologist.    Official radiology report(s): US Ob Comp Less 14 Wks  Result Date: 08/08/2017 CLINICAL DATA:  Pregnant patient in first-trimester pregnancy with right-sided pelvic pain after fall 6 hours ago. EXAM: OBSTETRIC <14 WK ULTRASOUND TECHNIQUE: Transabdominal ultrasound was performed for evaluation of the gestation as well as the maternal uterus and adnexal regions. COMPARISON:  None this pregnancy. FINDINGS: Intrauterine gestational sac: Single Yolk sac:  Visualized. Embryo:  Visualized. Cardiac Activity: Visualized. Heart Rate: 178 bpm CRL:   31 mm   10 w 0 d                  [place] EDC: 03/06/2018 Subchorionic hemorrhage: Small lateral to the gestational sac measuring 2.5 x 0.8 x 1.2 cm. Maternal uterus/adnexae: Both ovaries are visualized and are normal. No adnexal mass. No pelvic free fluid. IMPRESSION: Single live intrauterine pregnancy estimated gestational age [redacted] weeks 0 days based on crown-rump length for estimated date  of delivery 03/06/2018. Small subchorionic hemorrhage. Electronically Signed   By: Rubye Oaks M.D.   On: 08/08/2017 06:40     Procedures   ____________________________________________   INITIAL IMPRESSION / ASSESSMENT AND PLAN / ED COURSE  As part of my medical decision making, I reviewed the following data within the electronic MEDICAL RECORD 57 year old female presenting with history and physical exam consistent with hyperemesis gravidarum with dehydration and resultant syncope.  Ultrasound performed which revealed a 10-week gestation with a small subchorionic area of hemorrhage.  Patient given IV normal saline in the emergency department was able to eat.  Patient also given Zofran.  Spoke with the patient at length regarding Zofran and the risk of cleft lip cleft palate patient states that she took Zofran with her pregnancies without any incident and requested Zofran and as such she was prescribed it.  Patient advised to follow-up with OB/GYN ____________________________________________  FINAL CLINICAL IMPRESSION(S) / ED DIAGNOSES  Final diagnoses:  Hyperemesis gravidarum  Subchorionic hemorrhage of placenta in first trimester, single or unspecified fetus     MEDICATIONS GIVEN  DURING THIS VISIT:  Medications  ondansetron (ZOFRAN) injection 4 mg (4 mg Intravenous Given 08/08/17 0406)  0.9 %  sodium chloride infusion ( Intravenous Stopped 08/08/17 0455)     ED Discharge Orders    None       Note:  This document was prepared using Dragon voice recognition software and may include unintentional dictation errors.    Darci Current, MD 08/08/17 0730

## 2019-10-12 IMAGING — US US OB COMP LESS 14 WK
1 series · 14 of 28 positions shown · non-contrast
Comparison: None this pregnancy.

CLINICAL DATA: Pregnant patient in first-trimester pregnancy with
right-sided pelvic pain after fall 6 hours ago.

EXAM:
OBSTETRIC <14 WK ULTRASOUND
TECHNIQUE: Transabdominal ultrasound was performed for evaluation of the
gestation as well as the maternal uterus and adnexal regions.

[Series 1: us ob comp less 14 wk · 0.20mm/px · 62 acquisitions, 14 frames shown]
[im 3/62]
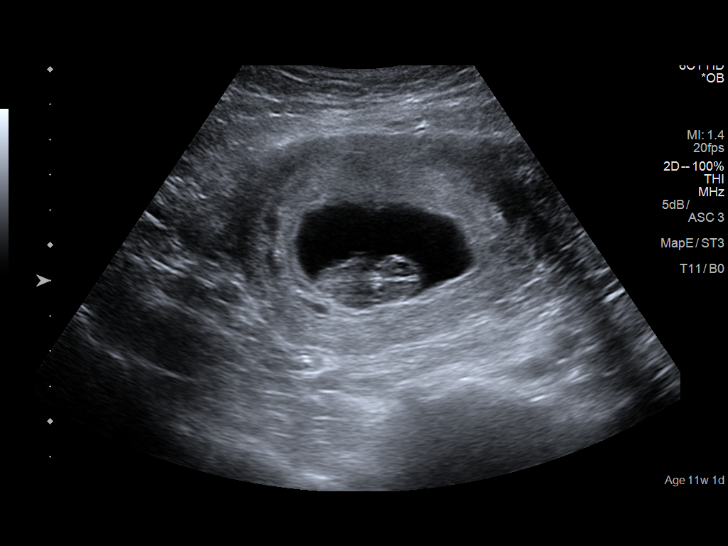
[im 7/62]
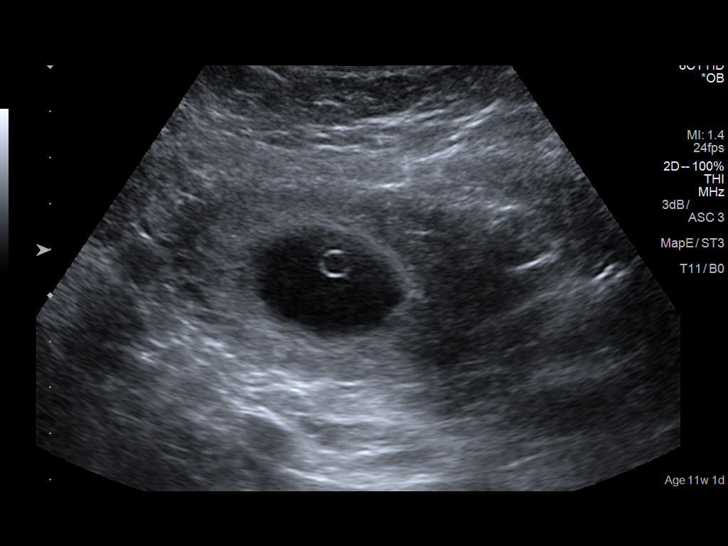
[im 12/62]
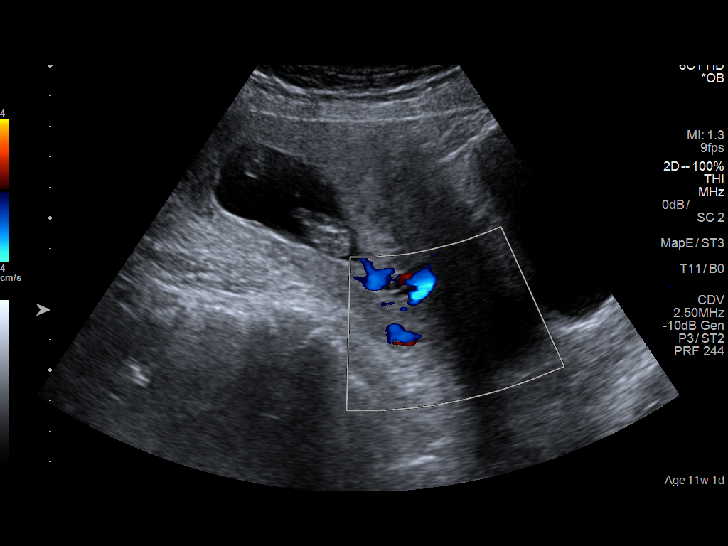
[im 16/62]
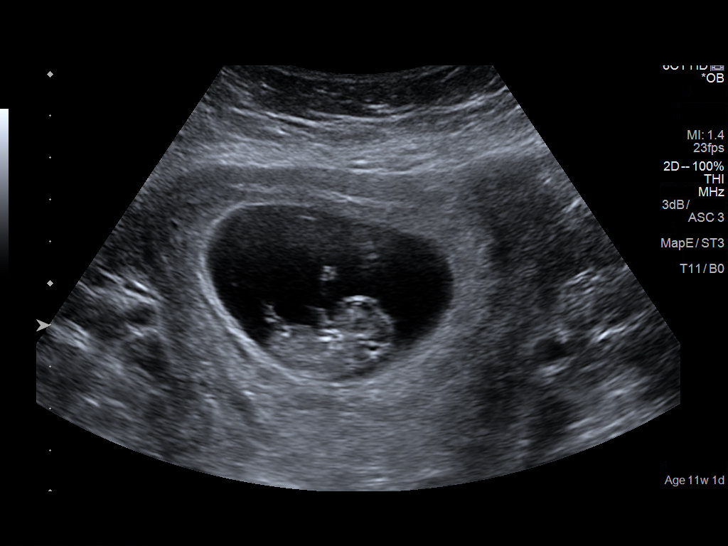
[im 21/62]
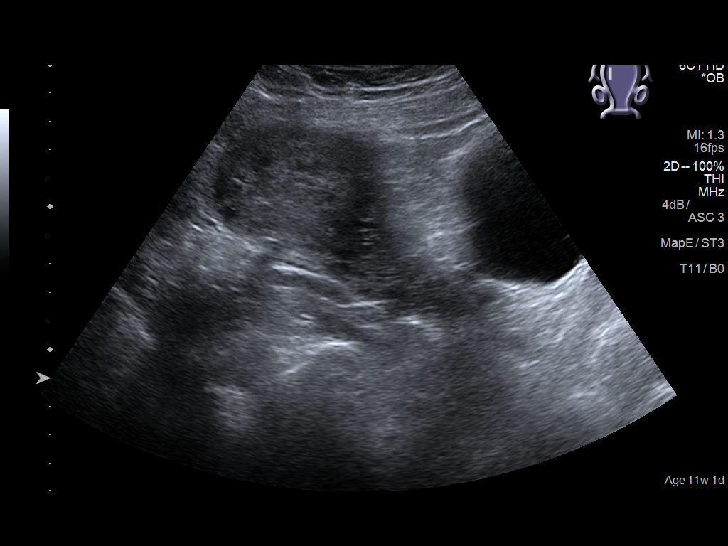
[im 25/62]
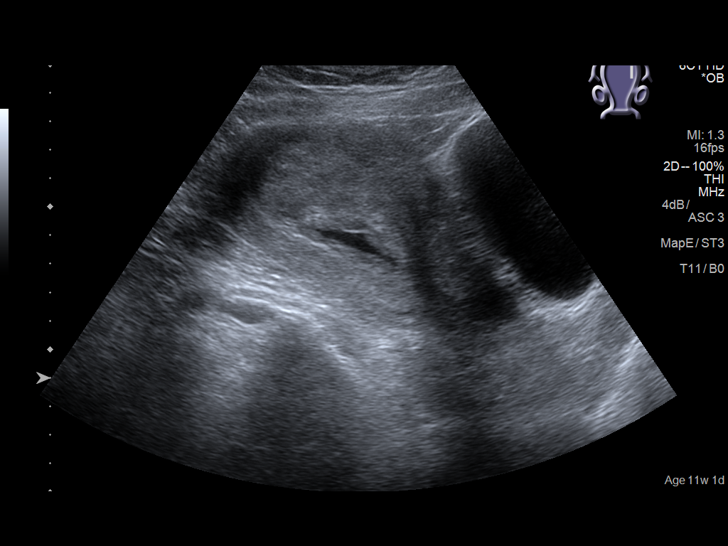
[im 30/62]
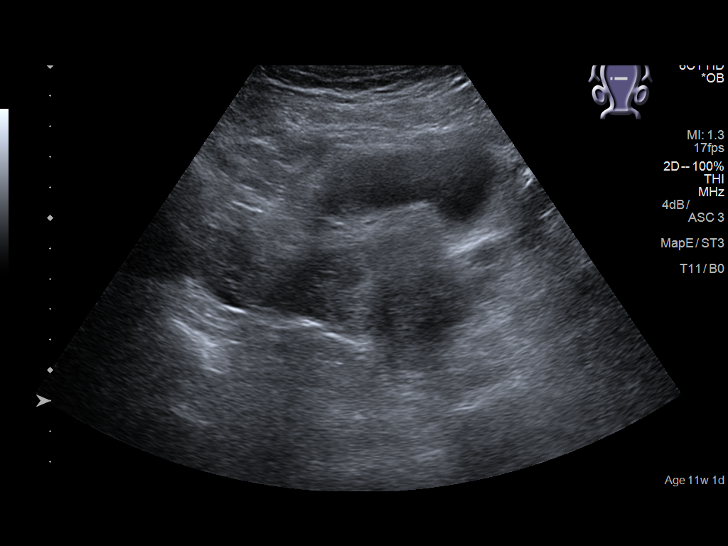
[im 34/62]
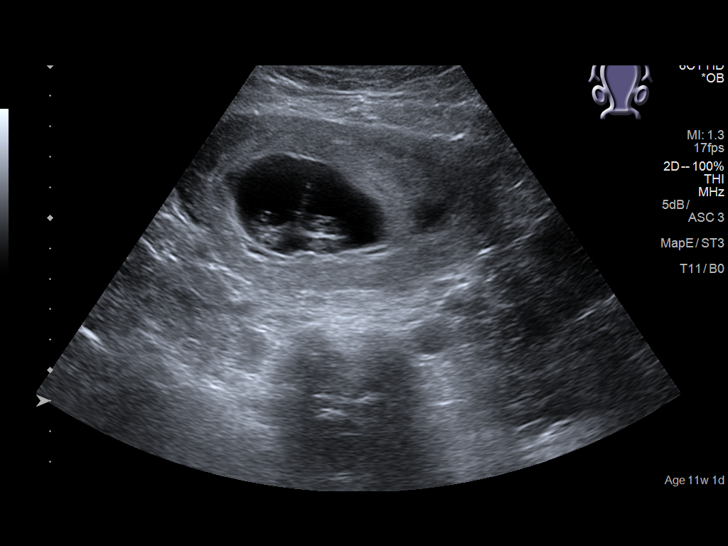
[im 39/62]
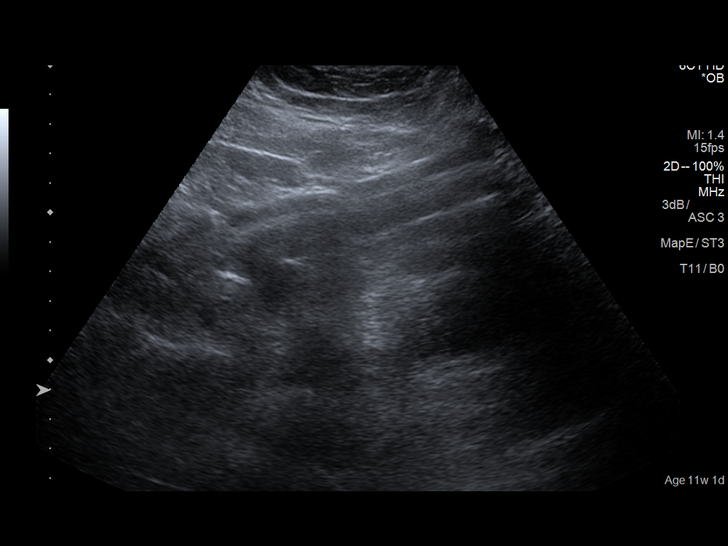
[im 43/62]
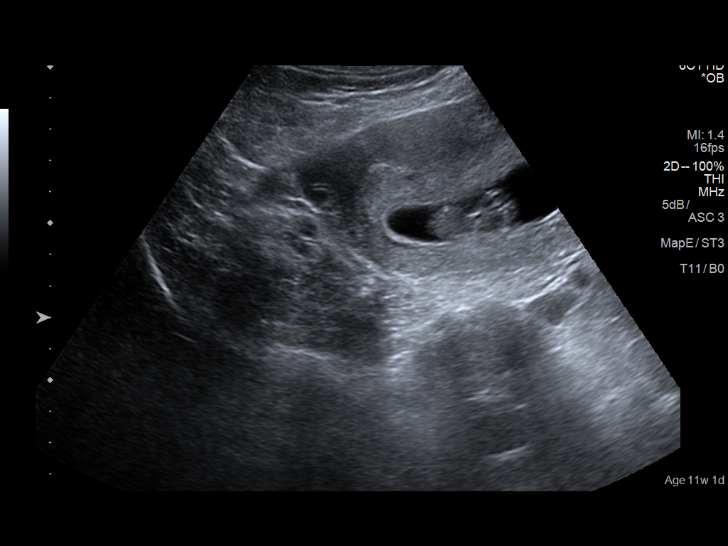
[im 48/62]
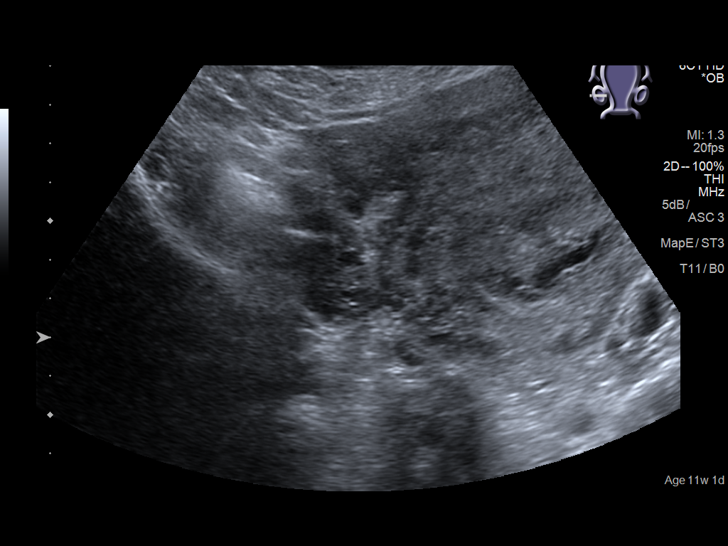
[im 52/62]
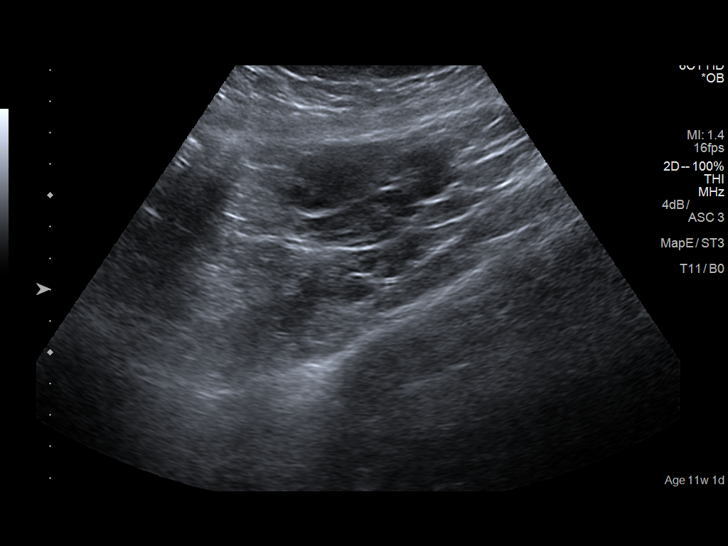
[im 57/62]
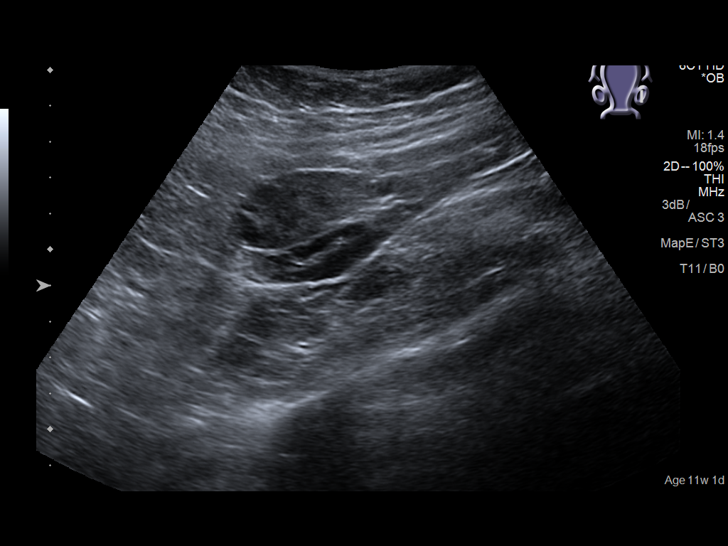
[im 62/62]
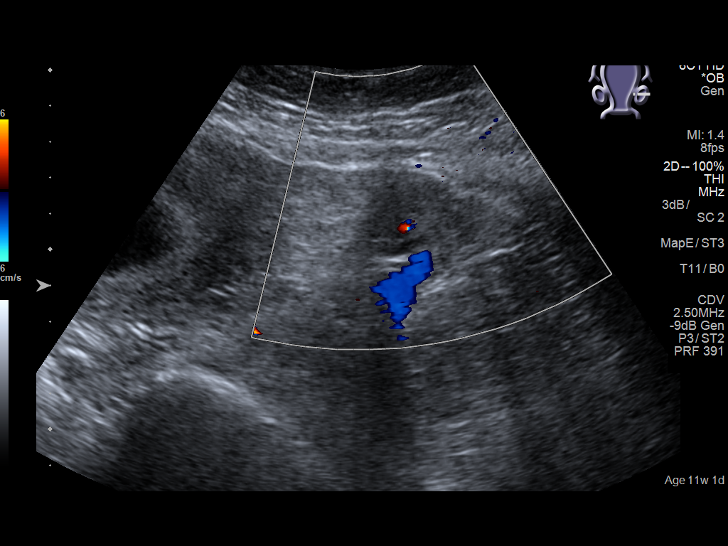

[14 of 28 positions shown; findings below may reference images not displayed]

FINDINGS: Intrauterine gestational sac: Single

Yolk sac:  Visualized.

Embryo:  Visualized.

Cardiac Activity: Visualized.

Heart Rate: 178 bpm

CRL:   31 mm   10 w 0 d                  US EDC: 03/06/2018

Subchorionic hemorrhage: Small lateral to the gestational sac
measuring 2.5 x 0.8 x 1.2 cm.

Maternal uterus/adnexae: Both ovaries are visualized and are normal.
No adnexal mass. No pelvic free fluid.
IMPRESSION: Single live intrauterine pregnancy estimated gestational age 10
weeks 0 days based on crown-rump length for estimated date of
delivery 03/06/2018. Small subchorionic hemorrhage.
